# Patient Record
Sex: Female | Born: 1985 | Race: White | Hispanic: No | Marital: Married | Smoking: Never smoker
Health system: Southern US, Community
[De-identification: ages and names within clinical notes are randomized; demographics above are authoritative.]

## PROBLEM LIST (undated history)

## (undated) DIAGNOSIS — R7611 Nonspecific reaction to tuberculin skin test without active tuberculosis: Secondary | ICD-10-CM

## (undated) DIAGNOSIS — Z8619 Personal history of other infectious and parasitic diseases: Secondary | ICD-10-CM

## (undated) HISTORY — PX: WISDOM TOOTH EXTRACTION: SHX21

## (undated) HISTORY — DX: Personal history of other infectious and parasitic diseases: Z86.19

## (undated) HISTORY — DX: Nonspecific reaction to tuberculin skin test without active tuberculosis: R76.11

---

## 2012-01-27 DIAGNOSIS — R7611 Nonspecific reaction to tuberculin skin test without active tuberculosis: Secondary | ICD-10-CM

## 2012-01-27 HISTORY — DX: Nonspecific reaction to tuberculin skin test without active tuberculosis: R76.11

## 2013-01-06 ENCOUNTER — Encounter (HOSPITAL_COMMUNITY): Payer: Self-pay | Admitting: Pharmacist

## 2013-01-06 ENCOUNTER — Encounter (HOSPITAL_COMMUNITY): Payer: Self-pay | Admitting: *Deleted

## 2013-01-09 NOTE — H&P (Signed)
NAMEERABELLA, KUIPERS               ACCOUNT NO.:  [id number]  MEDICAL RECORD NO.:  [id number]  LOCATION:  PERIO                         FACILITY:  [facility]  PHYSICIAN:  Duke Salvia. Marcelle Overlie, M.D.DATE OF BIRTH:  1985-09-26  DATE OF ADMISSION:  01/04/2013 DATE OF DISCHARGE:                             HISTORY & PHYSICAL   CHIEF COMPLAINT:  Missed abortion.  HISTORY OF PRESENT ILLNESS:  A 27 year old, G1, P0.  The patient has been followed since November 19 for positive UPT.  Initial ultrasound showed a sac, but no definite fetal pole.  This has been followed over the course of two to three weeks with no change in the size of the sac and no evidence of IUP.  Most recent ultrasound was one day preop to verify once again.  Presents now for D and E.  This procedure including specific risks related to bleeding, infection, other complications may require additional surgery discussed with her which she understands and accepts.  PAST MEDICAL HISTORY:  ALLERGIES:  None.  CURRENT MEDICATIONS:  Prenatal vitamins.  SURGICAL HISTORY:  None.  REVIEW OF SYSTEMS:  Otherwise negative.  FAMILY HISTORY:  She denies alcohol, tobacco, or drug use.  Her family doctor is [facility] in [place].  PHYSICAL EXAMINATION:  VITAL SIGNS:  Temp 92, blood pressure 120/72. HEENT:  Unremarkable. NECK:  Supple without masses. LUNGS:  Clear. CARDIOVASCULAR:  Regular rate and rhythm without murmurs, rubs, or gallops. BREASTS:  Without masses. ABDOMEN:  Soft, flat, nontender.  Vulva, vagina, cervix were normal. Uterus was 7-week size, mid position.  Adnexa negative. EXTREMITIES:  Unremarkable. NEUROLOGIC:  Unremarkable.  IMPRESSION:  Missed abortion.  PLAN:  D and E.  Procedure and risks discussed as above.     Janiesha Diehl M. Marcelle Overlie, M.D.     RM[facility]/MEDQ  D:  01/09/2013  T:  01/09/2013  Job:  [id number]

## 2013-01-09 NOTE — H&P (Signed)
Taylor Miles  DICTATION # [id number] CSN# [id number]   Meriel Pica, MD 01/09/2013 1:34 PM

## 2013-01-10 ENCOUNTER — Ambulatory Visit (HOSPITAL_COMMUNITY): Payer: BC Managed Care – PPO | Admitting: Anesthesiology

## 2013-01-10 ENCOUNTER — Encounter (HOSPITAL_COMMUNITY): Payer: Self-pay | Admitting: *Deleted

## 2013-01-10 ENCOUNTER — Ambulatory Visit (HOSPITAL_COMMUNITY)
Admission: RE | Admit: 2013-01-10 | Discharge: 2013-01-10 | Disposition: A | Discharge status: Home / Self Care | Payer: BC Managed Care – PPO | Source: Ambulatory Visit | Attending: Obstetrics and Gynecology | Admitting: Obstetrics and Gynecology

## 2013-01-10 ENCOUNTER — Encounter (HOSPITAL_COMMUNITY): Payer: BC Managed Care – PPO | Admitting: Anesthesiology

## 2013-01-10 ENCOUNTER — Encounter (HOSPITAL_COMMUNITY)
Admission: RE | Disposition: A | Discharge status: Home / Self Care | Payer: Self-pay | Source: Ambulatory Visit | Attending: Obstetrics and Gynecology

## 2013-01-10 DIAGNOSIS — O021 Missed abortion: Secondary | ICD-10-CM | POA: Insufficient documentation

## 2013-01-10 HISTORY — PX: DILATION AND EVACUATION: SHX1459

## 2013-01-10 LAB — CBC
HCT: 32.8 % — ABNORMAL LOW (ref 36.0–46.0)
MCHC: 34.8 g/dL (ref 30.0–36.0)
MCV: 84.5 fL (ref 78.0–100.0)
RDW: 12.7 % (ref 11.5–15.5)
WBC: 5.6 10*3/uL (ref 4.0–10.5)

## 2013-01-10 SURGERY — DILATION AND EVACUATION, UTERUS
Anesthesia: Monitor Anesthesia Care | Site: Uterus

## 2013-01-10 MED ORDER — SUFENTANIL CITRATE 50 MCG/ML IV SOLN: INTRAVENOUS | Status: DC | PRN

## 2013-01-10 MED ORDER — KETOROLAC TROMETHAMINE 30 MG/ML IJ SOLN
INTRAMUSCULAR | Status: AC
  Filled 2013-01-10: qty 1

## 2013-01-10 MED ORDER — HYDROCODONE-IBUPROFEN 7.5-200 MG PO TABS: 1.0000 | ORAL_TABLET | Freq: Three times a day (TID) | ORAL | Status: DC | PRN

## 2013-01-10 MED ORDER — PROPOFOL 10 MG/ML IV EMUL
INTRAVENOUS | Status: AC
  Filled 2013-01-10: qty 20

## 2013-01-10 MED ORDER — KETOROLAC TROMETHAMINE 30 MG/ML IJ SOLN: 15.0000 mg | Freq: Once | INTRAMUSCULAR | Status: DC | PRN

## 2013-01-10 MED ORDER — MIDAZOLAM HCL 2 MG/2ML IJ SOLN
INTRAMUSCULAR | Status: AC
  Filled 2013-01-10: qty 2

## 2013-01-10 MED ORDER — MEPERIDINE HCL 25 MG/ML IJ SOLN: 6.2500 mg | INTRAMUSCULAR | Status: DC | PRN

## 2013-01-10 MED ORDER — FENTANYL CITRATE 0.05 MG/ML IJ SOLN: 25.0000 ug | INTRAMUSCULAR | Status: DC | PRN

## 2013-01-10 MED ORDER — PROPOFOL 10 MG/ML IV EMUL
INTRAVENOUS | Status: DC | PRN
  Administered 2013-01-10: 40 mg via INTRAVENOUS
  Administered 2013-01-10: 50 mg via INTRAVENOUS
  Administered 2013-01-10: 30 mg via INTRAVENOUS
  Administered 2013-01-10: 50 mg via INTRAVENOUS
  Administered 2013-01-10: 40 mg via INTRAVENOUS

## 2013-01-10 MED ORDER — MIDAZOLAM HCL 2 MG/2ML IJ SOLN
INTRAMUSCULAR | Status: DC | PRN
  Administered 2013-01-10: 1 mg via INTRAVENOUS

## 2013-01-10 MED ORDER — FENTANYL CITRATE 0.05 MG/ML IJ SOLN
INTRAMUSCULAR | Status: AC
  Filled 2013-01-10: qty 5

## 2013-01-10 MED ORDER — LACTATED RINGERS IV SOLN
INTRAVENOUS | Status: DC
  Administered 2013-01-10 (×2): via INTRAVENOUS

## 2013-01-10 MED ORDER — METOCLOPRAMIDE HCL 5 MG/ML IJ SOLN: 10.0000 mg | Freq: Once | INTRAMUSCULAR | Status: DC | PRN

## 2013-01-10 MED ORDER — ONDANSETRON HCL 4 MG/2ML IJ SOLN
INTRAMUSCULAR | Status: AC
  Filled 2013-01-10: qty 2

## 2013-01-10 MED ORDER — LIDOCAINE HCL (CARDIAC) 20 MG/ML IV SOLN
INTRAVENOUS | Status: AC
  Filled 2013-01-10: qty 5

## 2013-01-10 MED ORDER — KETOROLAC TROMETHAMINE 30 MG/ML IJ SOLN
INTRAMUSCULAR | Status: DC | PRN
  Administered 2013-01-10: 30 mg via INTRAVENOUS

## 2013-01-10 MED ORDER — ONDANSETRON HCL 4 MG/2ML IJ SOLN
INTRAMUSCULAR | Status: DC | PRN
  Administered 2013-01-10: 4 mg via INTRAVENOUS

## 2013-01-10 MED ORDER — LIDOCAINE HCL 1 % IJ SOLN
INTRAMUSCULAR | Status: DC | PRN
  Administered 2013-01-10: 10 mL

## 2013-01-10 MED ORDER — FENTANYL CITRATE 0.05 MG/ML IJ SOLN
INTRAMUSCULAR | Status: DC | PRN
  Administered 2013-01-10 (×2): 25 ug via INTRAVENOUS
  Administered 2013-01-10: 100 ug via INTRAVENOUS
  Administered 2013-01-10 (×2): 25 ug via INTRAVENOUS

## 2013-01-10 MED ORDER — LIDOCAINE HCL 1 % IJ SOLN
INTRAMUSCULAR | Status: AC
  Filled 2013-01-10: qty 20

## 2013-01-10 SURGICAL SUPPLY — 19 items
CATH ROBINSON RED A/P 16FR (CATHETERS) ×2 IMPLANT
CLOTH BEACON ORANGE TIMEOUT ST (SAFETY) ×2 IMPLANT
DECANTER SPIKE VIAL GLASS SM (MISCELLANEOUS) ×2 IMPLANT
GLOVE BIO SURGEON STRL SZ7 (GLOVE) ×2 IMPLANT
GOWN STRL REIN XL XLG (GOWN DISPOSABLE) ×4 IMPLANT
KIT BERKELEY 1ST TRIMESTER 3/8 (MISCELLANEOUS) ×2 IMPLANT
NEEDLE SPNL 22GX3.5 QUINCKE BK (NEEDLE) ×2 IMPLANT
NS IRRIG 1000ML POUR BTL (IV SOLUTION) ×2 IMPLANT
PACK VAGINAL MINOR WOMEN LF (CUSTOM PROCEDURE TRAY) ×2 IMPLANT
PAD OB MATERNITY 4.3X12.25 (PERSONAL CARE ITEMS) ×2 IMPLANT
PAD PREP 24X48 CUFFED NSTRL (MISCELLANEOUS) ×2 IMPLANT
SET BERKELEY SUCTION TUBING (SUCTIONS) ×2 IMPLANT
SUT CHROMIC 2 0 UR 5 27 (SUTURE) ×2 IMPLANT
SYR CONTROL 10ML LL (SYRINGE) ×2 IMPLANT
TOWEL OR 17X24 6PK STRL BLUE (TOWEL DISPOSABLE) ×4 IMPLANT
VACURETTE 10 RIGID CVD (CANNULA) IMPLANT
VACURETTE 7MM CVD STRL WRAP (CANNULA) ×2 IMPLANT
VACURETTE 8 RIGID CVD (CANNULA) IMPLANT
VACURETTE 9 RIGID CVD (CANNULA) IMPLANT

## 2013-01-10 NOTE — Anesthesia Postprocedure Evaluation (Signed)
  Anesthesia Post-op Note  Patient: Taylor Miles  Procedure(s) Performed: Procedure(s): DILATATION AND EVACUATION (N/A) Patient is awake and responsive. Pain and nausea are reasonably well controlled. Vital signs are stable and clinically acceptable. Oxygen saturation is clinically acceptable. There are no apparent anesthetic complications at this time. Patient is ready for discharge.

## 2013-01-10 NOTE — Progress Notes (Signed)
The patient was re-examined with no change in status 

## 2013-01-10 NOTE — Anesthesia Preprocedure Evaluation (Addendum)
Anesthesia Evaluation  Patient identified by MRN, date of birth, ID band Patient awake    Reviewed: Allergy & Precautions, H&P , NPO status , Patient's Chart, lab work & pertinent test results, reviewed documented beta blocker date and time   History of Anesthesia Complications Negative for: history of anesthetic complications  Airway Mallampati: I TM Distance: >3 FB Neck ROM: full    Dental  (+) Teeth Intact   Pulmonary neg pulmonary ROS,  breath sounds clear to auscultation        Cardiovascular negative cardio ROS  Rhythm:regular Rate:Normal     Neuro/Psych negative neurological ROS  negative psych ROS   GI/Hepatic negative GI ROS, Neg liver ROS,   Endo/Other  negative endocrine ROS  Renal/GU negative Renal ROS  negative genitourinary   Musculoskeletal   Abdominal   Peds  Hematology negative hematology ROS (+)   Anesthesia Other Findings   Reproductive/Obstetrics (+) Pregnancy (missed ab 7 weeks)                          Anesthesia Physical Anesthesia Plan  ASA: II  Anesthesia Plan: MAC   Post-op Pain Management:    Induction:   Airway Management Planned:   Additional Equipment:   Intra-op Plan:   Post-operative Plan:   Informed Consent: I have reviewed the patients History and Physical, chart, labs and discussed the procedure including the risks, benefits and alternatives for the proposed anesthesia with the patient or authorized representative who has indicated his/her understanding and acceptance.     Plan Discussed with: Surgeon and CRNA  Anesthesia Plan Comments:         Anesthesia Quick Evaluation

## 2013-01-10 NOTE — Op Note (Signed)
Preoperative diagnosis: Missed AB  Postoperative diagnosis: Same  Procedure: D&E  Surgeon: Marcelle Overlie  Anesthesia: Gen.  Complications: None  Drains: In and out Foley catheter  EBL: Less than 50 cc  Procedure and findings:  The patient taken the operating room after an adequate level of general anesthesia was obtained with the patient's legs in stirrups the perineum and vagina were prepped and draped in the usual fashion for D&E. Bladder was drained, EUA was carried out the uterus was 6 weeks size mid position adnexa negative. Speculum was positioned cervix grasped with tenaculum, paracervical block was then created by infiltrating at 3 and 9:00 submucosally 5-7 cc 1% plain Xylocaine at each site after negative aspiration. The uterus was sounded to 8 cm, progressively dilated to a 27-29 Pratt dilator, a 7 curved suction curet was then used to curette a small to moderate amount of tissue. When no further tissue could be removed a small blunt curette was used to explore the cavity revealing to be clean there was minimal bleeding she tolerated this well went to recovery room in good condition.  Dictated with [facility]  Kwabena Strutz M. Milana Obey.D.

## 2013-01-10 NOTE — Transfer of Care (Signed)
Immediate Anesthesia Transfer of Care Note  Patient: Taylor Miles  Procedure(s) Performed: Procedure(s): DILATATION AND EVACUATION (N/A)  Patient Location: PACU  Anesthesia Type:MAC  Level of Consciousness: awake, alert  and oriented  Airway & Oxygen Therapy: Patient Spontanous Breathing  Post-op Assessment: Report given to PACU RN and Post -op Vital signs reviewed and stable  Post vital signs: stable  Complications: No apparent anesthesia complications

## 2013-01-11 ENCOUNTER — Encounter (HOSPITAL_COMMUNITY): Payer: Self-pay | Admitting: Obstetrics and Gynecology

## 2013-01-26 NOTE — L&D Delivery Note (Signed)
SVD of VFI at 2039 on 11/19/13.  EBL 450cc.  APGARs 8,9.  Placenta to L&D. Head delivered LOA and body delivered atraumatically.  Mouth and nose bulb suctioned.  Cord clamped, cut and baby to abdomen.  Cord blood obtained.  Placenta delivered S/I/3VC.  Fundus was firmed with pitocin and massage.  2nd degree perineal lac repaired with 3-0 Vicryl in the normal fashion.  Mom and baby stable.    Mitchel HonourMegan Krissi Willaims, DO

## 2013-04-12 LAB — OB RESULTS CONSOLE GC/CHLAMYDIA
CHLAMYDIA, DNA PROBE: NEGATIVE
Chlamydia: NEGATIVE
Gonorrhea: NEGATIVE
Gonorrhea: NEGATIVE

## 2013-04-12 LAB — OB RESULTS CONSOLE ANTIBODY SCREEN: Antibody Screen: NEGATIVE

## 2013-04-12 LAB — OB RESULTS CONSOLE ABO/RH: "RH Type ": POSITIVE

## 2013-04-12 LAB — OB RESULTS CONSOLE RPR
RPR: NONREACTIVE
RPR: NONREACTIVE

## 2013-04-12 LAB — OB RESULTS CONSOLE HIV ANTIBODY (ROUTINE TESTING)
HIV: NONREACTIVE
HIV: NONREACTIVE

## 2013-04-12 LAB — OB RESULTS CONSOLE RUBELLA ANTIBODY, IGM: Rubella: UNDETERMINED

## 2013-04-12 LAB — OB RESULTS CONSOLE HEPATITIS B SURFACE ANTIGEN: Hepatitis B Surface Ag: NEGATIVE

## 2013-04-13 LAB — OB RESULTS CONSOLE GC/CHLAMYDIA
CHLAMYDIA, DNA PROBE: NEGATIVE
GC PROBE AMP, GENITAL: NEGATIVE

## 2013-04-13 LAB — OB RESULTS CONSOLE RPR: RPR: NONREACTIVE

## 2013-04-13 LAB — OB RESULTS CONSOLE HIV ANTIBODY (ROUTINE TESTING): HIV: NONREACTIVE

## 2013-10-17 LAB — OB RESULTS CONSOLE GBS
GBS: NEGATIVE
STREP GROUP B AG: NEGATIVE

## 2013-10-18 LAB — OB RESULTS CONSOLE GBS: STREP GROUP B AG: NEGATIVE

## 2013-11-17 ENCOUNTER — Inpatient Hospital Stay (HOSPITAL_COMMUNITY)
Admission: AD | Admit: 2013-11-17 | Payer: BC Managed Care – PPO | Source: Ambulatory Visit | Admitting: Obstetrics and Gynecology

## 2013-11-19 ENCOUNTER — Inpatient Hospital Stay (HOSPITAL_COMMUNITY)
Admission: RE | Admit: 2013-11-19 | Discharge: 2013-11-21 | DRG: 775 | Disposition: A | Discharge status: Home / Self Care | Payer: 59 | Source: Ambulatory Visit | Attending: Obstetrics & Gynecology | Admitting: Obstetrics & Gynecology

## 2013-11-19 ENCOUNTER — Encounter (HOSPITAL_COMMUNITY): Payer: 59 | Admitting: Anesthesiology

## 2013-11-19 ENCOUNTER — Inpatient Hospital Stay (HOSPITAL_COMMUNITY): Payer: 59 | Admitting: Anesthesiology

## 2013-11-19 ENCOUNTER — Encounter (HOSPITAL_COMMUNITY): Payer: Self-pay

## 2013-11-19 DIAGNOSIS — Z3A40 40 weeks gestation of pregnancy: Secondary | ICD-10-CM | POA: Diagnosis present

## 2013-11-19 DIAGNOSIS — O480 Post-term pregnancy: Principal | ICD-10-CM | POA: Diagnosis present

## 2013-11-19 DIAGNOSIS — Z3490 Encounter for supervision of normal pregnancy, unspecified, unspecified trimester: Secondary | ICD-10-CM

## 2013-11-19 LAB — TYPE AND SCREEN
ABO/RH(D): O POS
Antibody Screen: NEGATIVE

## 2013-11-19 LAB — CBC
HCT: 36.9 % (ref 36.0–46.0)
Hemoglobin: 12.7 g/dL (ref 12.0–15.0)
MCH: 31.3 pg (ref 26.0–34.0)
MCHC: 34.4 g/dL (ref 30.0–36.0)
MCV: 90.9 fL (ref 78.0–100.0)
PLATELETS: 223 10*3/uL (ref 150–400)
RBC: 4.06 MIL/uL (ref 3.87–5.11)
RDW: 13.6 % (ref 11.5–15.5)
WBC: 9.6 10*3/uL (ref 4.0–10.5)

## 2013-11-19 LAB — RPR

## 2013-11-19 MED ORDER — CITRIC ACID-SODIUM CITRATE 334-500 MG/5ML PO SOLN: 30.0000 mL | ORAL | Status: DC | PRN

## 2013-11-19 MED ORDER — LIDOCAINE HCL (PF) 1 % IJ SOLN
30.0000 mL | INTRAMUSCULAR | Status: DC | PRN
  Filled 2013-11-19: qty 30

## 2013-11-19 MED ORDER — LACTATED RINGERS IV SOLN
INTRAVENOUS | Status: DC
  Administered 2013-11-19 (×2): via INTRAVENOUS

## 2013-11-19 MED ORDER — IBUPROFEN 600 MG PO TABS
600.0000 mg | ORAL_TABLET | Freq: Four times a day (QID) | ORAL | Status: DC
  Administered 2013-11-19 – 2013-11-21 (×8): 600 mg via ORAL
  Filled 2013-11-19 (×8): qty 1

## 2013-11-19 MED ORDER — FENTANYL 2.5 MCG/ML BUPIVACAINE 1/10 % EPIDURAL INFUSION (WH - ANES)
14.0000 mL/h | INTRAMUSCULAR | Status: DC | PRN
  Administered 2013-11-19 (×2): 14 mL/h via EPIDURAL
  Filled 2013-11-19: qty 125

## 2013-11-19 MED ORDER — OXYCODONE-ACETAMINOPHEN 5-325 MG PO TABS: 2.0000 | ORAL_TABLET | ORAL | Status: DC | PRN

## 2013-11-19 MED ORDER — OXYTOCIN 40 UNITS IN LACTATED RINGERS INFUSION - SIMPLE MED: 62.5000 mL/h | INTRAVENOUS | Status: DC

## 2013-11-19 MED ORDER — EPHEDRINE 5 MG/ML INJ
10.0000 mg | INTRAVENOUS | Status: DC | PRN
Start: 2013-11-19 — End: 2013-11-21
  Filled 2013-11-19: qty 2

## 2013-11-19 MED ORDER — LIDOCAINE HCL (PF) 1 % IJ SOLN
INTRAMUSCULAR | Status: DC | PRN
  Administered 2013-11-19 (×2): 5 mL

## 2013-11-19 MED ORDER — ONDANSETRON HCL 4 MG/2ML IJ SOLN
4.0000 mg | Freq: Four times a day (QID) | INTRAMUSCULAR | Status: DC | PRN
Start: 2013-11-19 — End: 2013-11-20
  Filled 2013-11-19: qty 2

## 2013-11-19 MED ORDER — LACTATED RINGERS IV SOLN: 500.0000 mL | INTRAVENOUS | Status: DC | PRN

## 2013-11-19 MED ORDER — OXYCODONE-ACETAMINOPHEN 5-325 MG PO TABS: 1.0000 | ORAL_TABLET | ORAL | Status: DC | PRN

## 2013-11-19 MED ORDER — OXYTOCIN BOLUS FROM INFUSION: 500.0000 mL | INTRAVENOUS | Status: DC

## 2013-11-19 MED ORDER — DIPHENHYDRAMINE HCL 50 MG/ML IJ SOLN: 12.5000 mg | INTRAMUSCULAR | Status: DC | PRN

## 2013-11-19 MED ORDER — TERBUTALINE SULFATE 1 MG/ML IJ SOLN: 0.2500 mg | Freq: Once | INTRAMUSCULAR | Status: AC | PRN

## 2013-11-19 MED ORDER — PHENYLEPHRINE 40 MCG/ML (10ML) SYRINGE FOR IV PUSH (FOR BLOOD PRESSURE SUPPORT)
80.0000 ug | PREFILLED_SYRINGE | INTRAVENOUS | Status: DC | PRN
  Filled 2013-11-19: qty 2

## 2013-11-19 MED ORDER — ACETAMINOPHEN 325 MG PO TABS: 650.0000 mg | ORAL_TABLET | ORAL | Status: DC | PRN

## 2013-11-19 MED ORDER — EPHEDRINE 5 MG/ML INJ
10.0000 mg | INTRAVENOUS | Status: DC | PRN
  Filled 2013-11-19: qty 2

## 2013-11-19 MED ORDER — OXYTOCIN 40 UNITS IN LACTATED RINGERS INFUSION - SIMPLE MED
1.0000 m[IU]/min | INTRAVENOUS | Status: DC
  Administered 2013-11-19: 2 m[IU]/min via INTRAVENOUS
  Filled 2013-11-19: qty 1000

## 2013-11-19 MED ORDER — FLEET ENEMA 7-19 GM/118ML RE ENEM: 1.0000 | ENEMA | RECTAL | Status: DC | PRN

## 2013-11-19 MED ORDER — LACTATED RINGERS IV SOLN: 500.0000 mL | Freq: Once | INTRAVENOUS | Status: DC

## 2013-11-19 NOTE — Anesthesia Procedure Notes (Signed)
Epidural Patient location during procedure: OB Start time: 11/19/2013 12:20 PM  Staffing Anesthesiologist: Brayton CavesJACKSON, Lemont Sitzmann Performed by: anesthesiologist   Preanesthetic Checklist Completed: patient identified, site marked, surgical consent, pre-op evaluation, timeout performed, IV checked, risks and benefits discussed and monitors and equipment checked  Epidural Patient position: sitting Prep: site prepped and draped and DuraPrep Patient monitoring: continuous pulse ox and blood pressure Approach: midline Location: L3-L4 Injection technique: LOR air  Needle:  Needle type: Tuohy  Needle gauge: 17 G Needle length: 9 cm and 9 Needle insertion depth: 4 cm Catheter type: closed end flexible Catheter size: 19 Gauge Catheter at skin depth: 10 cm Test dose: negative  Assessment Events: blood not aspirated, injection not painful, no injection resistance, negative IV test and no paresthesia  Additional Notes Patient identified.  Risk benefits discussed including failed block, incomplete pain control, headache, nerve damage, paralysis, blood pressure changes, nausea, vomiting, reactions to medication both toxic or allergic, and postpartum back pain.  Patient expressed understanding and wished to proceed.  All questions were answered.  Sterile technique used throughout procedure and epidural site dressed with sterile barrier dressing. No paresthesia or other complications noted.The patient did not experience any signs of intravascular injection such as tinnitus or metallic taste in mouth nor signs of intrathecal spread such as rapid motor block. Please see nursing notes for vital signs.

## 2013-11-19 NOTE — Progress Notes (Signed)
Pt sitting on commode and unable to urinate, bladder scan done, more than 200cc in bladder.  Pt states she would like to sit awhile longer and try to void before being i&o cath'd.

## 2013-11-19 NOTE — H&P (Signed)
Taylor Miles is a 28 y.o. female presenting for post-dates IOL.  Antepartum course has been uncomplicated.  GBS negative.  Maternal Medical History:  Contractions: Onset was 1 week ago.   Frequency: rare.   Perceived severity is mild.    Fetal activity: Perceived fetal activity is normal.   Last perceived fetal movement was within the past hour.    Prenatal complications: no prenatal complications Prenatal Complications - Diabetes: none.    OB History   Grav Para Term Preterm Abortions TAB SAB Ect Mult Living   2    1  1         No past medical history on file. Past Surgical History  Procedure Laterality Date  . Wisdom tooth extraction    . Dilation and evacuation N/A 01/10/2013    Procedure: DILATATION AND EVACUATION;  Surgeon: Meriel Picaichard M Holland, MD;  Location: [facility];  Service: Gynecology;  Laterality: N/A;   Family History: family history is not on file. Social History:  reports that she has never smoked. She has never used smokeless tobacco. She reports that she does not drink alcohol or use illicit drugs.   Prenatal Transfer Tool  Maternal Diabetes: No Genetic Screening: Normal Maternal Ultrasounds/Referrals: Normal Fetal Ultrasounds or other Referrals:  None Maternal Substance Abuse:  No Significant Maternal Medications:  None Significant Maternal Lab Results:  Lab values include: Group B Strep negative Other Comments:  None  ROS  Dilation: 3 Effacement (%): 50 Station: -2 Exam by:: Maico Mulvehill Blood pressure 99/67, pulse 93, temperature 97.7 F (36.5 C), temperature source Oral, resp. rate 18. AROM with clear fluid  Maternal Exam:  Uterine Assessment: Contraction strength is mild.  Contraction frequency is rare.   Abdomen: Patient reports no abdominal tenderness. Fundal height is c/w dates.   Estimated fetal weight is 7#8.   Fetal presentation: vertex  Introitus: Normal vulva. Amniotic fluid character: clear.  Pelvis: adequate for delivery.       Physical Exam  Constitutional: She is oriented to person, place, and time. She appears well-developed and well-nourished.  GI: Soft. There is no rebound and no guarding.  Neurological: She is alert and oriented to person, place, and time.  Skin: Skin is warm and dry.  Psychiatric: She has a normal mood and affect. Her behavior is normal.    Prenatal labs: ABO, Rh: --/--/O POS (10/25 0740) Antibody: NEG (10/25 0740) Rubella: Equivocal (03/18 0000) RPR: Nonreactive (03/19 0000)  HBsAg: Negative (03/18 0000)  HIV: Non-reactive (03/19 0000)  GBS: Negative (09/23 0000)   Assessment/Plan: 28yo G2P0 at 2764w2d for PDI -AROM performed -Will add pitocin as needed -Epidural if desired   Crispin Vogel 11/19/2013, 9:25 AM

## 2013-11-19 NOTE — Anesthesia Preprocedure Evaluation (Signed)

## 2013-11-19 NOTE — Progress Notes (Signed)
Pt straight cath'd on commode, pt than passes out.  Staff Assist button pressed, additional RN's in room, smelling salt given, pt awake and transferred to wc.

## 2013-11-20 LAB — CBC
HCT: 31.5 % — ABNORMAL LOW (ref 36.0–46.0)
HEMOGLOBIN: 10.9 g/dL — AB (ref 12.0–15.0)
MCH: 31.2 pg (ref 26.0–34.0)
MCHC: 34.6 g/dL (ref 30.0–36.0)
MCV: 90.3 fL (ref 78.0–100.0)
Platelets: 203 10*3/uL (ref 150–400)
RBC: 3.49 MIL/uL — AB (ref 3.87–5.11)
RDW: 13.7 % (ref 11.5–15.5)
WBC: 19.4 10*3/uL — ABNORMAL HIGH (ref 4.0–10.5)

## 2013-11-20 MED ORDER — BENZOCAINE-MENTHOL 20-0.5 % EX AERO
1.0000 "application " | INHALATION_SPRAY | CUTANEOUS | Status: DC | PRN
  Administered 2013-11-20: 1 via TOPICAL
  Filled 2013-11-20: qty 56

## 2013-11-20 MED ORDER — TETANUS-DIPHTH-ACELL PERTUSSIS 5-2.5-18.5 LF-MCG/0.5 IM SUSP: 0.5000 mL | Freq: Once | INTRAMUSCULAR | Status: DC

## 2013-11-20 MED ORDER — OXYCODONE-ACETAMINOPHEN 5-325 MG PO TABS: 1.0000 | ORAL_TABLET | ORAL | Status: DC | PRN

## 2013-11-20 MED ORDER — ONDANSETRON HCL 4 MG/2ML IJ SOLN
4.0000 mg | INTRAMUSCULAR | Status: DC | PRN
Start: 2013-11-20 — End: 2013-11-21

## 2013-11-20 MED ORDER — PRENATAL MULTIVITAMIN CH
1.0000 | ORAL_TABLET | Freq: Every day | ORAL | Status: DC
  Administered 2013-11-20 – 2013-11-21 (×2): 1 via ORAL
  Filled 2013-11-20 (×2): qty 1

## 2013-11-20 MED ORDER — DIPHENHYDRAMINE HCL 25 MG PO CAPS: 25.0000 mg | ORAL_CAPSULE | Freq: Four times a day (QID) | ORAL | Status: DC | PRN

## 2013-11-20 MED ORDER — ZOLPIDEM TARTRATE 5 MG PO TABS: 5.0000 mg | ORAL_TABLET | Freq: Every evening | ORAL | Status: DC | PRN

## 2013-11-20 MED ORDER — SENNOSIDES-DOCUSATE SODIUM 8.6-50 MG PO TABS
2.0000 | ORAL_TABLET | ORAL | Status: DC
  Administered 2013-11-20 – 2013-11-21 (×2): 2 via ORAL
  Filled 2013-11-20 (×2): qty 2

## 2013-11-20 MED ORDER — ONDANSETRON HCL 4 MG PO TABS: 4.0000 mg | ORAL_TABLET | ORAL | Status: DC | PRN

## 2013-11-20 MED ORDER — OXYCODONE-ACETAMINOPHEN 5-325 MG PO TABS: 2.0000 | ORAL_TABLET | ORAL | Status: DC | PRN

## 2013-11-20 MED ORDER — LANOLIN HYDROUS EX OINT: TOPICAL_OINTMENT | CUTANEOUS | Status: DC | PRN

## 2013-11-20 MED ORDER — DIBUCAINE 1 % RE OINT: 1.0000 "application " | TOPICAL_OINTMENT | RECTAL | Status: DC | PRN

## 2013-11-20 MED ORDER — SIMETHICONE 80 MG PO CHEW: 80.0000 mg | CHEWABLE_TABLET | ORAL | Status: DC | PRN

## 2013-11-20 MED ORDER — WITCH HAZEL-GLYCERIN EX PADS
1.0000 "application " | MEDICATED_PAD | CUTANEOUS | Status: DC | PRN
  Administered 2013-11-20: 1 via TOPICAL

## 2013-11-20 NOTE — Lactation Note (Signed)
This note was copied from the chart of Girl Jaci Smolinsky. Lactation Consultation Note Mom states baby has BF well to Lt. Breast for 20 min. Breast to Rt. Breast 10-15 min. Earlier, but has difficulty getting baby latched. Assisted baby to Rt. Breast. Stated felt slight pinching, demonstrated chin tug for deeper latch. Noted good latch and suckling. Hand expression taught w/no colostrum noted.  Mom has very soft breast. States hasn't noted any colostrum. Mom encouraged to feed baby 8-12 times/24 hours and with feeding cues. Mom encouraged to feed baby w/feeding cuesEncouraged to call for assistance if needed and to verify proper latch.[facility] brochure given w/resources, support groups and LC services.Mother informed of post-discharge support and given phone number to the lactation department, including services for phone call assistance; out-patient appointments; and breastfeeding support group. List of other breastfeeding resources in the community given in the handout. Encouraged mother to call for problems or concerns related to breastfeeding. Educated about newborn behavior. Referred to Baby and Me Book in Breastfeeding section Pg. 22-23 for position options and Proper latch demonstration.Mom encouraged to do skin-to-skin.Mom encouraged to waken baby for feeds.  Patient Name: Girl Ashok Norrismina Witucki WUJWJ'XToday's Date: 11/20/2013 Reason for consult: Initial assessment   Maternal Data Has patient been taught Hand Expression?: Yes Does the patient have breastfeeding experience prior to this delivery?: No  Feeding Feeding Type: Breast Fed Length of feed: 7 min  LATCH Score/Interventions Latch: Repeated attempts needed to sustain latch, nipple held in mouth throughout feeding, stimulation needed to elicit sucking reflex. Intervention(s): Adjust position;Assist with latch;Breast massage;Breast compression  Audible Swallowing: None Intervention(s): Skin to skin;Hand expression  Type of Nipple: Everted at  rest and after stimulation  Comfort (Breast/Nipple): Soft / non-tender     Hold (Positioning): Assistance needed to correctly position infant at breast and maintain latch. Intervention(s): Breastfeeding basics reviewed;Support Pillows;Position options;Skin to skin  LATCH Score: 6  Lactation Tools Discussed/Used     Consult Status Consult Status: Follow-up Date: 11/20/13 Follow-up type: In-patient    Charyl DancerCARVER, Kule Gascoigne G 11/20/2013, 4:44 AM

## 2013-11-20 NOTE — Progress Notes (Signed)
Post Partum Day 1 Subjective: no complaints, up ad lib, voiding and tolerating PO  Objective: Blood pressure 101/62, pulse 79, temperature 98.3 F (36.8 C), temperature source Oral, resp. rate 18, height 5' 7.5" (1.715 m), weight 158 lb (71.668 kg), SpO2 99.00%, unknown if currently breastfeeding.  Physical Exam:  General: alert and cooperative Lochia: appropriate Uterine Fundus: firm Incision: healing well DVT Evaluation: No evidence of DVT seen on physical exam. Negative Homan's sign. No cords or calf tenderness. No significant calf/ankle edema.   Recent Labs  11/19/13 0740 11/20/13 0557  HGB 12.7 10.9*  HCT 36.9 31.5*    Assessment/Plan: Plan for discharge tomorrow   LOS: 1 day   Niam Nepomuceno G 11/20/2013, 8:22 AM

## 2013-11-20 NOTE — Anesthesia Postprocedure Evaluation (Signed)
Anesthesia Post Note  Patient: Taylor Miles  Procedure(s) Performed: * No procedures listed *  Anesthesia type: Epidural  Patient location: Mother/Baby  Post pain: Pain level controlled  Post assessment: Post-op Vital signs reviewed  Last Vitals:  Filed Vitals:   11/20/13 0339  BP: 101/62  Pulse: 79  Temp: 36.8 C  Resp: 18    Post vital signs: Reviewed  Level of consciousness:alert  Complications: No apparent anesthesia complications

## 2013-11-21 ENCOUNTER — Ambulatory Visit: Payer: Self-pay

## 2013-11-21 MED ORDER — MEASLES, MUMPS & RUBELLA VAC ~~LOC~~ INJ
0.5000 mL | INJECTION | Freq: Once | SUBCUTANEOUS | Status: AC
  Administered 2013-11-21: 0.5 mL via SUBCUTANEOUS
  Filled 2013-11-21 (×2): qty 0.5

## 2013-11-21 NOTE — Lactation Note (Signed)
This note was copied from the chart of Girl Virginia Rump. Lactation Consultation Note  Patient Name: Girl Ashok Norrismina Mooneyham AVWUJ'WToday's Date: 11/21/2013  baby has been very fussy, crying and latching on and off the breast during feedings. Assisted mother with positioning and latching baby in football, cross cradle and side-lying because baby did not settle and continued to show aggressive cues to feed. Mother's nipples and red, blistered, and inflamed. She is using a #20 Nipple shield for latching to decrease pain when direct contact with the nipple is made. She is using comfort gels and expressing milk onto nipples to promote healing. Oral gum massage and suck training exercises were done with the baby and taught to the mother. Baby was able to latch deeply onto the breast using the shield and small amount colostrum noted. Mother becoming emotional due to baby being fussy and not becoming satisfied after almost one hour of breast feeding. Unable to hand express colostrum to spoon feed. With parents consent. 2-3 ml of Similac hydrolyzed formula was inserted with curved tip syringe into the shield. Baby fed aggressively and fell asleep. Instructed parents how to use curved syringe as needed. Lactation appointment scheduled for Oct. 30 at 4pm.   Maternal Data    Feeding    LATCH Score/Interventions                      Lactation Tools Discussed/Used     Consult Status      Omar PersonDaly, Gabrielle Mester M 11/21/2013, 7:11 PM

## 2013-11-21 NOTE — Discharge Summary (Signed)
Obstetric Discharge Summary Reason for Admission: induction of labor Prenatal Procedures: ultrasound Intrapartum Procedures: spontaneous vaginal delivery Postpartum Procedures: none Complications-Operative and Postpartum: 2 degree perineal laceration Hemoglobin  Date Value Ref Range Status  11/20/2013 10.9* 12.0 - 15.0 g/dL Final     HCT  Date Value Ref Range Status  11/20/2013 31.5* 36.0 - 46.0 % Final    Physical Exam:  General: alert and cooperative Lochia: appropriate Uterine Fundus: firm Incision: healing well DVT Evaluation: No evidence of DVT seen on physical exam. Negative Homan's sign. No cords or calf tenderness. No significant calf/ankle edema.  Discharge Diagnoses: Term Pregnancy-delivered  Discharge Information: Date: 11/21/2013 Activity: pelvic rest Diet: routine Medications: PNV and Ibuprofen Condition: stable Instructions: refer to practice specific booklet Discharge to: home   Newborn Data: Live born female  Birth Weight: 8 lb 6.8 oz (3820 g) APGAR: 8, 9  Home with mother.  Taylor Miles G 11/21/2013, 8:25 AM

## 2013-11-24 ENCOUNTER — Ambulatory Visit (HOSPITAL_COMMUNITY): Payer: 59

## 2013-11-27 ENCOUNTER — Ambulatory Visit: Payer: Self-pay

## 2013-11-27 ENCOUNTER — Encounter (HOSPITAL_COMMUNITY): Payer: Self-pay

## 2014-01-10 ENCOUNTER — Other Ambulatory Visit: Payer: Self-pay | Admitting: Obstetrics and Gynecology

## 2014-01-11 LAB — CYTOLOGY - PAP

## 2015-01-24 LAB — OB RESULTS CONSOLE ABO/RH: RH Type: POSITIVE

## 2015-01-24 LAB — OB RESULTS CONSOLE GBS: GBS: POSITIVE

## 2015-01-24 LAB — OB RESULTS CONSOLE HIV ANTIBODY (ROUTINE TESTING): HIV: NONREACTIVE

## 2015-01-24 LAB — OB RESULTS CONSOLE GC/CHLAMYDIA
Chlamydia: NEGATIVE
Gonorrhea: NEGATIVE

## 2015-01-24 LAB — OB RESULTS CONSOLE HEPATITIS B SURFACE ANTIGEN: HEP B S AG: NEGATIVE

## 2015-01-24 LAB — OB RESULTS CONSOLE RUBELLA ANTIBODY, IGM: RUBELLA: IMMUNE

## 2015-01-24 LAB — OB RESULTS CONSOLE RPR: RPR: NONREACTIVE

## 2015-01-24 LAB — OB RESULTS CONSOLE ANTIBODY SCREEN: Antibody Screen: NEGATIVE

## 2015-01-27 NOTE — L&D Delivery Note (Signed)
Delivery Note At 2:54 PM a viable female was delivered via  (Presentation: ;  ).  APGAR: , ; weight  .   Placenta status:spont>>intact , .  Cord:  with the following complications: .  Cord pH: not sent Tight nuchal X 1 , clamped + cut Anesthesia: Epidural  Episiotomy:  none Lacerations: sec deg  Suture Repair: 3.0 vicryl rapide Est. Blood Loss (mL):  400  Mom to postpartum.  Baby to Couplet care / Skin to Skin.  Adnan Vanvoorhis M 07/26/2015, 3:11 PM

## 2015-06-04 ENCOUNTER — Other Ambulatory Visit: Payer: Self-pay | Admitting: Obstetrics and Gynecology

## 2015-06-04 DIAGNOSIS — N6001 Solitary cyst of right breast: Secondary | ICD-10-CM

## 2015-06-05 ENCOUNTER — Ambulatory Visit
Admission: RE | Admit: 2015-06-05 | Discharge: 2015-06-05 | Disposition: A | Discharge status: Home / Self Care | Payer: 59 | Source: Ambulatory Visit | Attending: Obstetrics and Gynecology | Admitting: Obstetrics and Gynecology

## 2015-06-05 ENCOUNTER — Other Ambulatory Visit: Payer: Self-pay | Admitting: Obstetrics and Gynecology

## 2015-06-05 DIAGNOSIS — N6310 Unspecified lump in the right breast, unspecified quadrant: Secondary | ICD-10-CM

## 2015-06-05 DIAGNOSIS — N6001 Solitary cyst of right breast: Secondary | ICD-10-CM

## 2015-06-06 ENCOUNTER — Other Ambulatory Visit: Payer: Self-pay | Admitting: Obstetrics and Gynecology

## 2015-06-06 DIAGNOSIS — N6310 Unspecified lump in the right breast, unspecified quadrant: Secondary | ICD-10-CM

## 2015-06-07 ENCOUNTER — Ambulatory Visit
Admission: RE | Admit: 2015-06-07 | Discharge: 2015-06-07 | Disposition: A | Discharge status: Home / Self Care | Payer: 59 | Source: Ambulatory Visit | Attending: Obstetrics and Gynecology | Admitting: Obstetrics and Gynecology

## 2015-06-07 DIAGNOSIS — N6310 Unspecified lump in the right breast, unspecified quadrant: Secondary | ICD-10-CM

## 2015-06-13 ENCOUNTER — Other Ambulatory Visit: Payer: Self-pay

## 2015-07-25 ENCOUNTER — Telehealth (HOSPITAL_COMMUNITY): Payer: Self-pay | Admitting: *Deleted

## 2015-07-25 ENCOUNTER — Encounter (HOSPITAL_COMMUNITY): Payer: Self-pay | Admitting: *Deleted

## 2015-07-25 NOTE — H&P (Signed)
Anette Riedelmina Blake  DICTATION # [id number] CSN# [id number]   Meriel PicaHOLLAND,Keirra Zeimet M, MD 07/25/2015 1:43 PM

## 2015-07-25 NOTE — Telephone Encounter (Signed)
Preadmission screen  

## 2015-07-26 ENCOUNTER — Inpatient Hospital Stay (HOSPITAL_COMMUNITY): Payer: 59 | Admitting: Anesthesiology

## 2015-07-26 ENCOUNTER — Inpatient Hospital Stay (HOSPITAL_COMMUNITY)
Admission: RE | Admit: 2015-07-26 | Discharge: 2015-07-28 | DRG: 775 | Disposition: A | Discharge status: Home / Self Care | Payer: 59 | Source: Ambulatory Visit | Attending: Obstetrics and Gynecology | Admitting: Obstetrics and Gynecology

## 2015-07-26 ENCOUNTER — Encounter (HOSPITAL_COMMUNITY): Payer: Self-pay

## 2015-07-26 DIAGNOSIS — Z3A39 39 weeks gestation of pregnancy: Secondary | ICD-10-CM | POA: Diagnosis not present

## 2015-07-26 DIAGNOSIS — O99824 Streptococcus B carrier state complicating childbirth: Secondary | ICD-10-CM | POA: Diagnosis present

## 2015-07-26 DIAGNOSIS — Z3490 Encounter for supervision of normal pregnancy, unspecified, unspecified trimester: Secondary | ICD-10-CM

## 2015-07-26 LAB — CBC
HEMATOCRIT: 36.2 % (ref 36.0–46.0)
HEMOGLOBIN: 12.5 g/dL (ref 12.0–15.0)
MCH: 30.9 pg (ref 26.0–34.0)
MCHC: 34.5 g/dL (ref 30.0–36.0)
MCV: 89.6 fL (ref 78.0–100.0)
Platelets: 206 10*3/uL (ref 150–400)
RBC: 4.04 MIL/uL (ref 3.87–5.11)
RDW: 13.7 % (ref 11.5–15.5)
WBC: 8.8 10*3/uL (ref 4.0–10.5)

## 2015-07-26 LAB — TYPE AND SCREEN
ABO/RH(D): O POS
Antibody Screen: NEGATIVE

## 2015-07-26 LAB — RPR: RPR: NONREACTIVE

## 2015-07-26 MED ORDER — LACTATED RINGERS IV SOLN: 500.0000 mL | Freq: Once | INTRAVENOUS | Status: DC

## 2015-07-26 MED ORDER — SIMETHICONE 80 MG PO CHEW: 80.0000 mg | CHEWABLE_TABLET | ORAL | Status: DC | PRN

## 2015-07-26 MED ORDER — DIPHENHYDRAMINE HCL 50 MG/ML IJ SOLN: 12.5000 mg | INTRAMUSCULAR | Status: DC | PRN

## 2015-07-26 MED ORDER — EPHEDRINE 5 MG/ML INJ
10.0000 mg | INTRAVENOUS | Status: DC | PRN
  Filled 2015-07-26: qty 2

## 2015-07-26 MED ORDER — ZOLPIDEM TARTRATE 5 MG PO TABS: 5.0000 mg | ORAL_TABLET | Freq: Every evening | ORAL | Status: DC | PRN

## 2015-07-26 MED ORDER — SENNOSIDES-DOCUSATE SODIUM 8.6-50 MG PO TABS
2.0000 | ORAL_TABLET | ORAL | Status: DC
  Administered 2015-07-27 (×2): 2 via ORAL
  Filled 2015-07-26 (×2): qty 2

## 2015-07-26 MED ORDER — MEASLES, MUMPS & RUBELLA VAC ~~LOC~~ INJ: 0.5000 mL | INJECTION | Freq: Once | SUBCUTANEOUS | Status: DC

## 2015-07-26 MED ORDER — OXYTOCIN 40 UNITS IN LACTATED RINGERS INFUSION - SIMPLE MED: 2.5000 [IU]/h | INTRAVENOUS | Status: DC

## 2015-07-26 MED ORDER — LIDOCAINE HCL (PF) 1 % IJ SOLN
30.0000 mL | INTRAMUSCULAR | Status: DC | PRN
  Filled 2015-07-26: qty 30

## 2015-07-26 MED ORDER — OXYTOCIN 40 UNITS IN LACTATED RINGERS INFUSION - SIMPLE MED
1.0000 m[IU]/min | INTRAVENOUS | Status: DC
  Administered 2015-07-26: 2 m[IU]/min via INTRAVENOUS
  Filled 2015-07-26: qty 1000

## 2015-07-26 MED ORDER — SOD CITRATE-CITRIC ACID 500-334 MG/5ML PO SOLN: 30.0000 mL | ORAL | Status: DC | PRN

## 2015-07-26 MED ORDER — OXYCODONE-ACETAMINOPHEN 5-325 MG PO TABS: 2.0000 | ORAL_TABLET | ORAL | Status: DC | PRN

## 2015-07-26 MED ORDER — DIBUCAINE 1 % RE OINT
1.0000 "application " | TOPICAL_OINTMENT | RECTAL | Status: DC | PRN
  Administered 2015-07-26: 1 via RECTAL
  Filled 2015-07-26: qty 28

## 2015-07-26 MED ORDER — PHENYLEPHRINE 40 MCG/ML (10ML) SYRINGE FOR IV PUSH (FOR BLOOD PRESSURE SUPPORT)
80.0000 ug | PREFILLED_SYRINGE | INTRAVENOUS | Status: DC | PRN
Start: 2015-07-26 — End: 2015-07-28
  Filled 2015-07-26: qty 5
  Filled 2015-07-26: qty 10

## 2015-07-26 MED ORDER — WITCH HAZEL-GLYCERIN EX PADS
1.0000 "application " | MEDICATED_PAD | CUTANEOUS | Status: DC | PRN
  Administered 2015-07-26: 1 via TOPICAL

## 2015-07-26 MED ORDER — ONDANSETRON HCL 4 MG/2ML IJ SOLN: 4.0000 mg | Freq: Four times a day (QID) | INTRAMUSCULAR | Status: DC | PRN

## 2015-07-26 MED ORDER — DIPHENHYDRAMINE HCL 25 MG PO CAPS: 25.0000 mg | ORAL_CAPSULE | Freq: Four times a day (QID) | ORAL | Status: DC | PRN

## 2015-07-26 MED ORDER — FLEET ENEMA 7-19 GM/118ML RE ENEM: 1.0000 | ENEMA | Freq: Every day | RECTAL | Status: DC | PRN

## 2015-07-26 MED ORDER — ACETAMINOPHEN 325 MG PO TABS: 650.0000 mg | ORAL_TABLET | ORAL | Status: DC | PRN

## 2015-07-26 MED ORDER — LIDOCAINE HCL (PF) 1 % IJ SOLN
INTRAMUSCULAR | Status: DC | PRN
  Administered 2015-07-26: 5 mL
  Administered 2015-07-26: 2 mL
  Administered 2015-07-26: 3 mL

## 2015-07-26 MED ORDER — LACTATED RINGERS IV SOLN
INTRAVENOUS | Status: DC
  Administered 2015-07-26: 08:00:00 via INTRAVENOUS

## 2015-07-26 MED ORDER — LACTATED RINGERS IV SOLN: 500.0000 mL | INTRAVENOUS | Status: DC | PRN

## 2015-07-26 MED ORDER — OXYCODONE-ACETAMINOPHEN 5-325 MG PO TABS: 1.0000 | ORAL_TABLET | ORAL | Status: DC | PRN

## 2015-07-26 MED ORDER — IBUPROFEN 800 MG PO TABS
800.0000 mg | ORAL_TABLET | Freq: Three times a day (TID) | ORAL | Status: DC | PRN
  Administered 2015-07-26 – 2015-07-28 (×5): 800 mg via ORAL
  Filled 2015-07-26 (×7): qty 1

## 2015-07-26 MED ORDER — TERBUTALINE SULFATE 1 MG/ML IJ SOLN
0.2500 mg | Freq: Once | INTRAMUSCULAR | Status: DC | PRN
  Filled 2015-07-26: qty 1

## 2015-07-26 MED ORDER — PHENYLEPHRINE 40 MCG/ML (10ML) SYRINGE FOR IV PUSH (FOR BLOOD PRESSURE SUPPORT)
80.0000 ug | PREFILLED_SYRINGE | INTRAVENOUS | Status: DC | PRN
  Filled 2015-07-26: qty 5

## 2015-07-26 MED ORDER — ACETAMINOPHEN 325 MG PO TABS
650.0000 mg | ORAL_TABLET | ORAL | Status: DC | PRN
Start: 2015-07-26 — End: 2015-07-28

## 2015-07-26 MED ORDER — OXYTOCIN BOLUS FROM INFUSION: 500.0000 mL | INTRAVENOUS | Status: DC

## 2015-07-26 MED ORDER — BENZOCAINE-MENTHOL 20-0.5 % EX AERO
1.0000 "application " | INHALATION_SPRAY | CUTANEOUS | Status: DC | PRN
  Administered 2015-07-26: 1 via TOPICAL
  Filled 2015-07-26: qty 56

## 2015-07-26 MED ORDER — DEXTROSE 5 % IV SOLN
2.5000 10*6.[IU] | INTRAVENOUS | Status: DC
  Filled 2015-07-26 (×13): qty 2.5

## 2015-07-26 MED ORDER — PRENATAL MULTIVITAMIN CH
1.0000 | ORAL_TABLET | Freq: Every day | ORAL | Status: DC
  Administered 2015-07-27: 1 via ORAL
  Filled 2015-07-26: qty 1

## 2015-07-26 MED ORDER — TETANUS-DIPHTH-ACELL PERTUSSIS 5-2.5-18.5 LF-MCG/0.5 IM SUSP: 0.5000 mL | Freq: Once | INTRAMUSCULAR | Status: DC

## 2015-07-26 MED ORDER — COCONUT OIL OIL
1.0000 "application " | TOPICAL_OIL | Status: DC | PRN
  Administered 2015-07-26: 1 via TOPICAL
  Filled 2015-07-26: qty 120

## 2015-07-26 MED ORDER — FLEET ENEMA 7-19 GM/118ML RE ENEM: 1.0000 | ENEMA | RECTAL | Status: DC | PRN

## 2015-07-26 MED ORDER — ONDANSETRON HCL 4 MG/2ML IJ SOLN: 4.0000 mg | INTRAMUSCULAR | Status: DC | PRN

## 2015-07-26 MED ORDER — FENTANYL 2.5 MCG/ML BUPIVACAINE 1/10 % EPIDURAL INFUSION (WH - ANES)
14.0000 mL/h | INTRAMUSCULAR | Status: DC | PRN
  Administered 2015-07-26: 14 mL/h via EPIDURAL
  Filled 2015-07-26: qty 125

## 2015-07-26 MED ORDER — ONDANSETRON HCL 4 MG PO TABS: 4.0000 mg | ORAL_TABLET | ORAL | Status: DC | PRN

## 2015-07-26 MED ORDER — BISACODYL 10 MG RE SUPP: 10.0000 mg | Freq: Every day | RECTAL | Status: DC | PRN

## 2015-07-26 MED ORDER — PENICILLIN G POTASSIUM 5000000 UNITS IJ SOLR
5.0000 10*6.[IU] | Freq: Once | INTRAMUSCULAR | Status: AC
  Administered 2015-07-26: 5 10*6.[IU] via INTRAVENOUS
  Filled 2015-07-26: qty 5

## 2015-07-26 NOTE — Anesthesia Preprocedure Evaluation (Signed)
Anesthesia Evaluation  Patient identified by MRN, date of birth, ID band Patient awake    Reviewed: Allergy & Precautions, Patient's Chart, lab work & pertinent test results  Airway Mallampati: II       Dental   Pulmonary neg pulmonary ROS,    Pulmonary exam normal        Cardiovascular negative cardio ROS Normal cardiovascular exam     Neuro/Psych negative neurological ROS     GI/Hepatic negative GI ROS, Neg liver ROS,   Endo/Other  negative endocrine ROS  Renal/GU negative Renal ROS     Musculoskeletal   Abdominal   Peds  Hematology negative hematology ROS (+)   Anesthesia Other Findings   Reproductive/Obstetrics (+) Pregnancy                             Lab Results  Component Value Date   WBC 8.8 07/26/2015   HGB 12.5 07/26/2015   HCT 36.2 07/26/2015   MCV 89.6 07/26/2015   PLT 206 07/26/2015   No results found for: CREATININE, BUN, NA, K, CL, CO2  Anesthesia Physical Anesthesia Plan  ASA: II  Anesthesia Plan: Epidural   Post-op Pain Management:    Induction:   Airway Management Planned: Natural Airway  Additional Equipment:   Intra-op Plan:   Post-operative Plan:   Informed Consent: I have reviewed the patients History and Physical, chart, labs and discussed the procedure including the risks, benefits and alternatives for the proposed anesthesia with the patient or authorized representative who has indicated his/her understanding and acceptance.     Plan Discussed with:   Anesthesia Plan Comments:         Anesthesia Quick Evaluation

## 2015-07-26 NOTE — Anesthesia Pain Management Evaluation Note (Signed)
  [facility] Pain Management Visit Note  Patient: Taylor NorrisEmina Furches, 30 y.o., female  "Hello I am a [facility] anesthesia [facility]. We have an anesthesia team available at all times to provide care throughout the hospital, including epidural management and anesthesia for C-section. I don't know your plan for the delivery whether it a natural birth, water birth, IV sedation, nitrous supplementation, doula or epidural, but we want to meet your pain goals."   1.Was your pain managed to your expectations on prior hospitalizations?   Yes   2.What is your expectation for pain management during this hospitalization?     Epidural  3.How can we help you reach that goal? epidural  Record the patient's initial score and the patient's pain goal.   Pain: 0  Pain Goal: 4 [facility] wants you to be able to say your pain was always managed very well.  Rendi Mapel 07/26/2015

## 2015-07-26 NOTE — Progress Notes (Signed)
2/50/vtx, AROM>>clr, will start PCn

## 2015-07-26 NOTE — Anesthesia Procedure Notes (Signed)
Epidural Patient location during procedure: OB  Staffing Anesthesiologist: Marcene DuosFITZGERALD, Benard Minturn Performed by: anesthesiologist   Preanesthetic Checklist Completed: patient identified, site marked, surgical consent, pre-op evaluation, timeout performed, IV checked, risks and benefits discussed and monitors and equipment checked  Epidural Patient position: sitting Prep: site prepped and draped and DuraPrep Patient monitoring: continuous pulse ox and blood pressure Approach: midline Location: L4-L5 Injection technique: LOR saline  Needle:  Needle type: Tuohy  Needle gauge: 17 G Needle length: 9 cm and 9 Needle insertion depth: 5 cm cm Catheter type: closed end flexible Catheter size: 19 Gauge Catheter at skin depth: 10 cm Test dose: negative  Assessment Events: blood not aspirated, injection not painful, no injection resistance, negative IV test and no paresthesia

## 2015-07-27 LAB — CBC
HCT: 32.8 % — ABNORMAL LOW (ref 36.0–46.0)
HEMOGLOBIN: 11.3 g/dL — AB (ref 12.0–15.0)
MCH: 31 pg (ref 26.0–34.0)
MCHC: 34.5 g/dL (ref 30.0–36.0)
MCV: 89.9 fL (ref 78.0–100.0)
PLATELETS: 180 10*3/uL (ref 150–400)
RBC: 3.65 MIL/uL — ABNORMAL LOW (ref 3.87–5.11)
RDW: 13.7 % (ref 11.5–15.5)
WBC: 11.9 10*3/uL — ABNORMAL HIGH (ref 4.0–10.5)

## 2015-07-27 NOTE — Lactation Note (Signed)
This note was copied from a baby's chart. Lactation Consultation Note; Mom called for assist with latch. Baby latched with NS when I went into room. I adjusted his head slightly and mom reports pain level of 4. Small amount of Colostrum noted in NS when he came off the breast. Baby fussy and still rooting. Would not latch to other breast in football hold. Mom hand expressed a few drops and baby licked it off her finger. Attempted in cross cradle hold baby latched but mom reports pain level of 6-7. Did ease off slightly. Still nursing as I left room. Mom has not pumped yet. Discussed cluster feeding the second night. No further questions at present. To call for assist prn  Patient Name: Taylor Miles XBJYN'WToday's Date: 07/27/2015 Reason for consult: Follow-up assessment   Maternal Data Formula Feeding for Exclusion: No Has patient been taught Hand Expression?: Yes Does the patient have breastfeeding experience prior to this delivery?: Yes  Feeding Feeding Type: Breast Fed  LATCH Score/Interventions Latch: Grasps breast easily, tongue down, lips flanged, rhythmical sucking. Intervention(s): Adjust position;Assist with latch;Breast massage;Breast compression  Audible Swallowing: A few with stimulation Intervention(s): Skin to skin;Hand expression  Type of Nipple: Everted at rest and after stimulation  Comfort (Breast/Nipple): Engorged, cracked, bleeding, large blisters, severe discomfort  Problem noted: Mild/Moderate discomfort Interventions  (Cracked/bleeding/bruising/blister): Expressed breast milk to nipple Interventions (Mild/moderate discomfort): Comfort gels;Hand expression Interventions (Severe discomfort): Double electric pum  Hold (Positioning): Assistance needed to correctly position infant at breast and maintain latch.  LATCH Score: 3  Lactation Tools Discussed/Used Tools: Nipple Shields Nipple shield size: 24 WIC Program: No Pump Review: Setup, frequency, and  cleaning Initiated by:: Taylor Miles Date initiated:: 07/27/15   Consult Status Consult Status: Follow-up Date: 07/28/15 Follow-up type: In-patient    Pamelia HoitWeeks, Pat Elicker D 07/27/2015, 2:23 PM

## 2015-07-27 NOTE — Anesthesia Postprocedure Evaluation (Signed)
Anesthesia Post Note  Patient: Meranda Halberstam  Procedure(s) Performed: * No procedures listed *  Patient location during evaluation: Mother Baby Anesthesia Type: Epidural Level of consciousness: awake and alert Pain management: satisfactory to patient Vital Signs Assessment: post-procedure vital signs reviewed and stable Respiratory status: respiratory function stable Cardiovascular status: stable Postop Assessment: no headache, no backache, epidural receding, patient able to bend at knees, no signs of nausea or vomiting and adequate PO intake Anesthetic complications: no Comments: Comfort level was assessed by AnesthesiaTeam and the patient was pleased with the care, interventions, and services provided by the Department of Anesthesia.     Last Vitals:  Filed Vitals:   07/26/15 2215 07/27/15 0602  BP: 103/61 95/62  Pulse: 62 79  Temp: 36.7 C   Resp: 18 18    Last Pain:  Filed Vitals:   07/27/15 0602  PainSc: 3    Pain Goal: Patients Stated Pain Goal: 3 (07/26/15 1930)               Karleen DolphinFUSSELL,Yasmene Salomone

## 2015-07-27 NOTE — Progress Notes (Signed)
Post Partum Day 1 Subjective: no complaints  Objective: Blood pressure 95/62, pulse 79, temperature 98 F (36.7 C), temperature source Oral, resp. rate 18, height 5\' 8"  (1.727 m), weight 156 lb (70.761 kg), last menstrual period 10/24/2014, SpO2 100 %, unknown if currently breastfeeding.  Physical Exam:  General: alert Lochia: appropriate Uterine Fundus: firm Incision: healing well DVT Evaluation: No evidence of DVT seen on physical exam.   Recent Labs  07/26/15 0745 07/27/15 0613  HGB 12.5 11.3*  HCT 36.2 32.8*    Assessment/Plan: Plan for discharge tomorrow   LOS: 1 day   Lasandra Batley M 07/27/2015, 9:08 AM

## 2015-07-27 NOTE — Lactation Note (Signed)
This note was copied from a baby's chart. Lactation Consultation Note; Initial visit with mom. She complains of severe pain with latch. Both nipples with cracking noted. Using #24 NS, reports that feels some better but still painful,. Tried #20 but states #24 felt better. Baby had circ this morning and is sleepy now. Attempted to latch without NS. Took a few sucks and mom reports much pain. Used #24 NS and mom still complains of pain. Took a few sucks then off to sleep. Mom reports feels like baby is biting at the breast. When sucking on my gloved finger baby took a while to get in a good sucking pattern and seems to have limited tongue mobility. Encouraged mom to talk with Ped about this at next visit. Offered pumping to mom to promote milk supply and mom agreeable. DEBP setup for mom- reviewed use and cleaning of pump pieces. Has Medela pump at home. Encouraged to page for assist when baby wakes for feeding. BF brochure given with resources for support after DC. No questions at present. Reports she had much pain with first baby, had a low milk supply and gave up after a few Jowanna Loeffler. Mom states she has mass in right breast. Has ultrasound on it - no changes in 3 months.   Patient Name: Boy Ashok Norrismina Bissonnette AVWUJ'WToday's Date: 07/27/2015 Reason for consult: Initial assessment   Maternal Data Formula Feeding for Exclusion: No Has patient been taught Hand Expression?: Yes Does the patient have breastfeeding experience prior to this delivery?: Yes  Feeding Feeding Type: Breast Fed  LATCH Score/Interventions Latch: Too sleepy or reluctant, no latch achieved, no sucking elicited. (few painful sucks) Intervention(s): Adjust position;Assist with latch;Breast massage;Breast compression  Audible Swallowing: None Intervention(s): Skin to skin;Hand expression  Type of Nipple: Everted at rest and after stimulation  Comfort (Breast/Nipple): Engorged, cracked, bleeding, large blisters, severe discomfort  Problem  noted: Severe discomfort Interventions  (Cracked/bleeding/bruising/blister): Expressed breast milk to nipple Interventions (Mild/moderate discomfort): Comfort gels Interventions (Severe discomfort): Double electric pum  Hold (Positioning): Assistance needed to correctly position infant at breast and maintain latch.  LATCH Score: 3  Lactation Tools Discussed/Used Tools: Nipple Shields Nipple shield size: 24 WIC Program: No Pump Review: Setup, frequency, and cleaning Initiated by:: DW Date initiated:: 07/27/15   Consult Status Consult Status: Follow-up Date: 07/27/15 Follow-up type: In-patient    Pamelia HoitWeeks, Lashauna Arpin D 07/27/2015, 12:48 PM

## 2015-07-28 ENCOUNTER — Inpatient Hospital Stay (HOSPITAL_COMMUNITY): Admission: AD | Admit: 2015-07-28 | Payer: 59 | Source: Ambulatory Visit | Admitting: Obstetrics and Gynecology

## 2015-07-28 NOTE — Progress Notes (Signed)
Reviewed Discharge instructions for mom and baby in room. Patient had no questions. Discharge AVS signed. Mom instructed to call out to nurses station when they are ready to leave and baby is in car seat. Hugs tag will be removed directly before leaving hospital.  

## 2015-07-28 NOTE — Discharge Summary (Signed)
Obstetric Discharge Summary Reason for Admission: induction of labor Prenatal Procedures: none Intrapartum Procedures: spontaneous vaginal delivery Postpartum Procedures: none Complications-Operative and Postpartum: none HEMOGLOBIN  Date Value Ref Range Status  07/27/2015 11.3* 12.0 - 15.0 g/dL Final   HCT  Date Value Ref Range Status  07/27/2015 32.8* 36.0 - 46.0 % Final    Physical Exam:  General: alert Lochia: appropriate Uterine Fundus: firm Incision: healing well DVT Evaluation: No evidence of DVT seen on physical exam.  Discharge Diagnoses: Term Pregnancy-delivered  Discharge Information: Date: 07/28/2015 Activity: pelvic rest Diet: routine Medications: PNV and Ibuprofen Condition: stable Instructions: refer to practice specific booklet Discharge to: home Follow-up Information    Schedule an appointment as soon as possible for a visit in 6 weeks to follow up.      Newborn Data: Live born female  Birth Weight: 7 lb 4.6 oz (3305 g) APGAR: 8, 9  Home with mother.  Taylor Miles M 07/28/2015, 8:42 AM

## 2015-07-31 NOTE — H&P (Signed)
Taylor Miles, Taylor Miles               ACCOUNT NO.:  [id number]  MEDICAL RECORD NO.:  [id number]  LOCATION:                                FACILITY:  [facility]  PHYSICIAN:  Duke Salviaichard M. Marcelle OverlieHolland, M.D.DATE OF BIRTH:  June 29, 1985  DATE OF ADMISSION:  07/26/2015 DATE OF DISCHARGE:                             HISTORY & PHYSICAL   CHIEF COMPLAINT:  For labor induction at term, favorable cervix.  HISTORY OF PRESENT ILLNESS:  A 30 year old, 663, P1-0-1-1, EDD is July 5, presents for induction at term with a favorable cervix.  She has a positive GBS screen.  Her 1 hour GTT was normal at 132 blood type is O positive.  First trimester screen returned normal.  PAST MEDICAL HISTORY:  Please see the [facility] form for details.  PHYSICAL EXAMINATION:  VITAL SIGNS:  Temp 98.2, blood pressure 108/68. HEENT:  Unremarkable. NECK:  Supple without masses. LUNGS:  Clear. CARDIOVASCULAR:  Regular rate and rhythm without murmurs, rubs, gallops noted. BREASTS:  Without masses. GU:  Term fundal height.  Fetal heart rate 140, cervix was 2, 50% vertex -2. EXTREMITIES:  Unremarkable. NEUROLOGIC:  Unremarkable.  IMPRESSION:  Term pregnancy, favorable cervix for AROM induction, IV penicillin for positive GBS.     Billye Pickerel M. Marcelle OverlieHolland, M.D.     [facility]MED[facility]  D:  07/25/2015  T:  07/25/2015  Job:  [id number]

## 2017-04-12 IMAGING — US US RT BREAST BX
1 series · 13 of 14 positions shown · non-contrast
Comparison: Previous exam(s).

ADDENDUM:
Pathology revealed BENIGN FIBROGLANDULAR BREAST TISSUE WITH
GESTATIONAL CHANGES of the Right axillary tail. This was found to be
concordant by Dr. Bobi Hoff. Pathology results were discussed
with the patient by telephone. The patient reported doing well after
the biopsy with tenderness and bruising at the site. Post biopsy
instructions and care were reviewed and questions were answered. The
patient was encouraged to call [REDACTED] for any additional concerns. The patient was asked to return
for a right breast\axillary tail ultrasound in 6 months and
informed a reminder notice would be sent regarding this appointment.

Pathology results reported by Ancsii Musitz, RN on 06/11/2015.
CLINICAL DATA: Patient presents for ultrasound-guided core needle
biopsy of a heterogeneous mass in the axillary tail region of the
right breast.
EXAM:
ULTRASOUND GUIDED RIGHT BREAST CORE NEEDLE BIOPSY

[Series 1: advbreast · 13 of 14 slices shown]
[im 1/14]
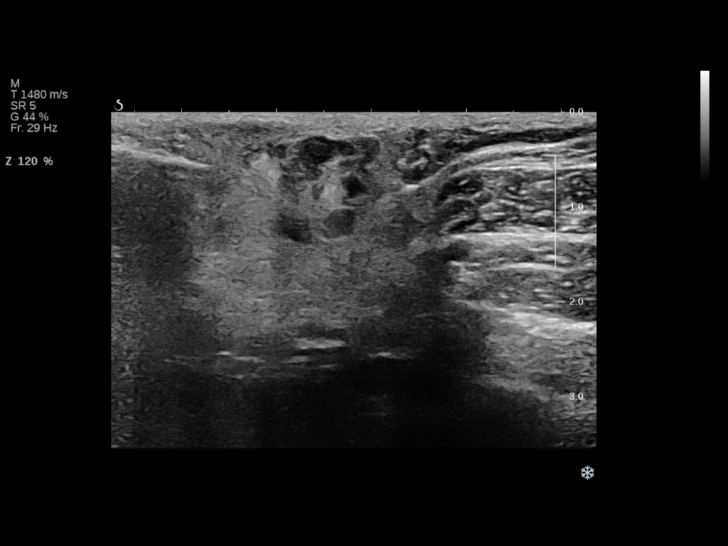
[im 2/14]
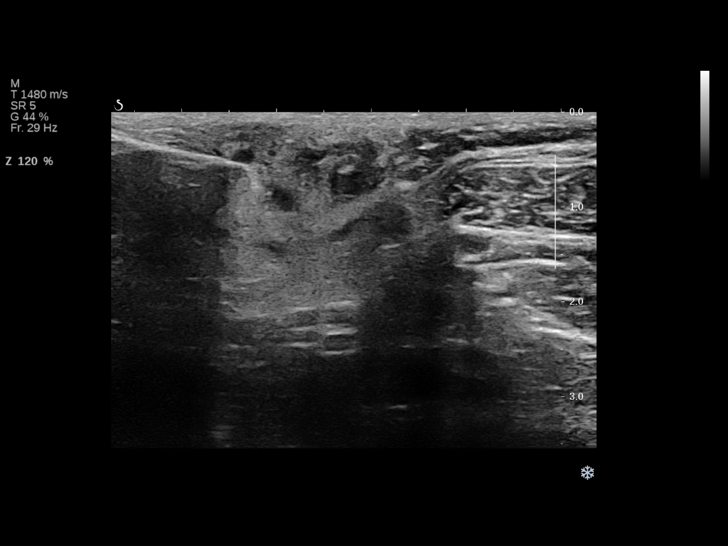
[im 3/14]
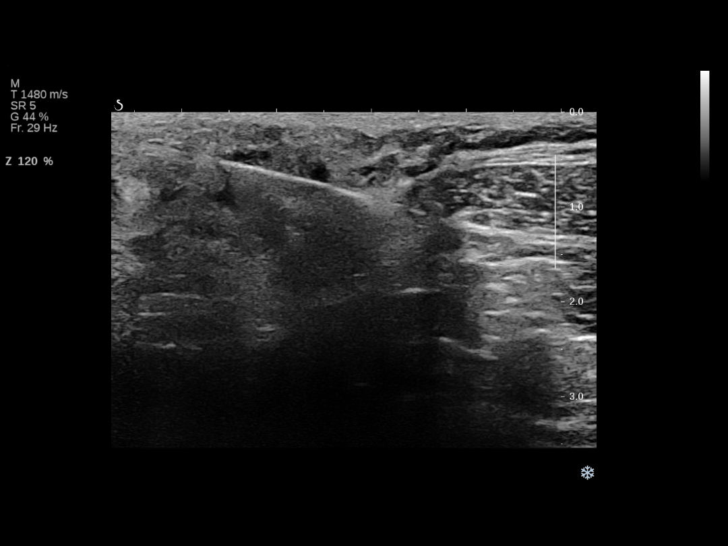
[im 4/14]
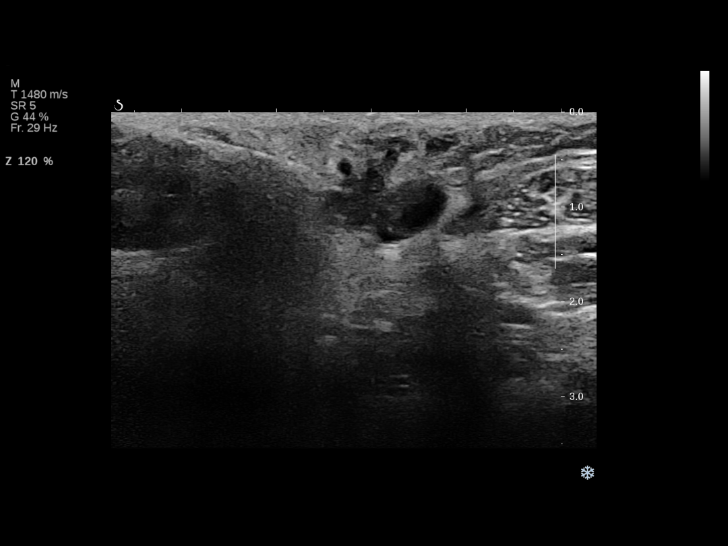
[im 5/14]
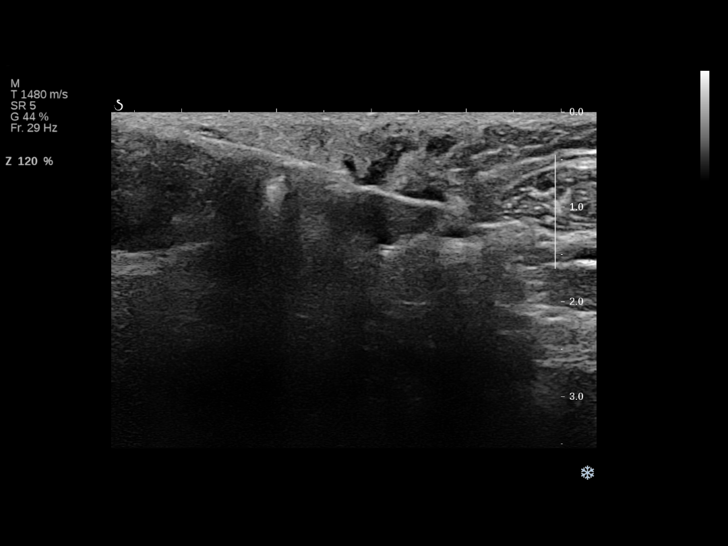
[im 6/14]
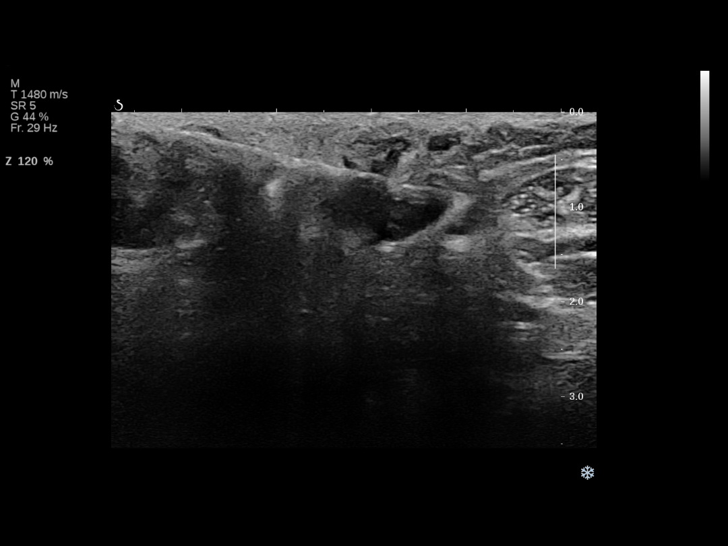
[im 8/14]
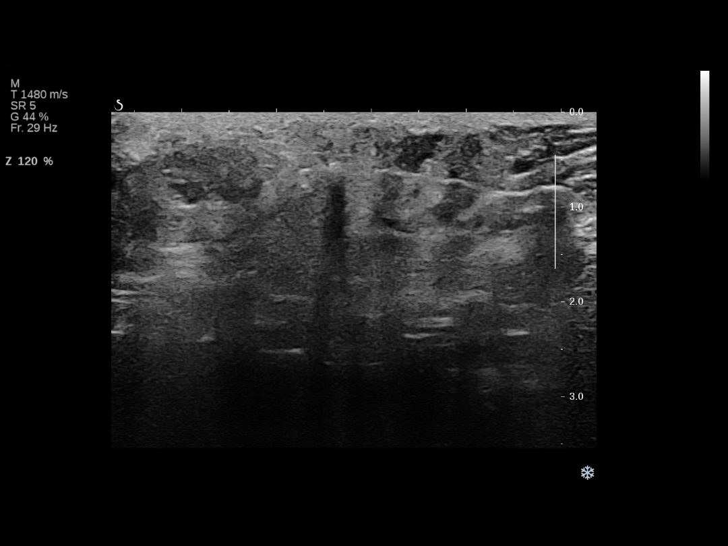
[im 9/14]
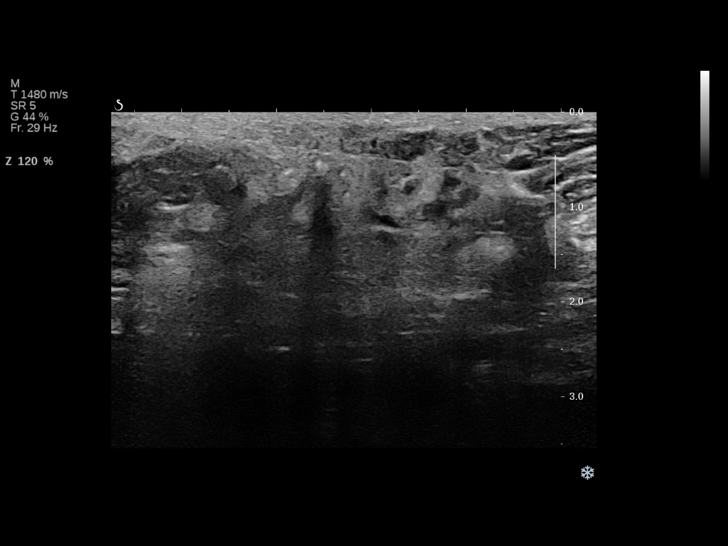
[im 10/14]
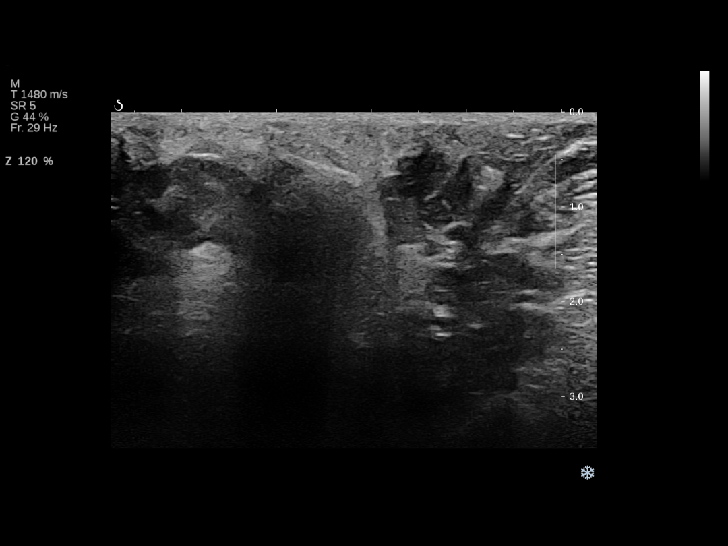
[im 11/14]
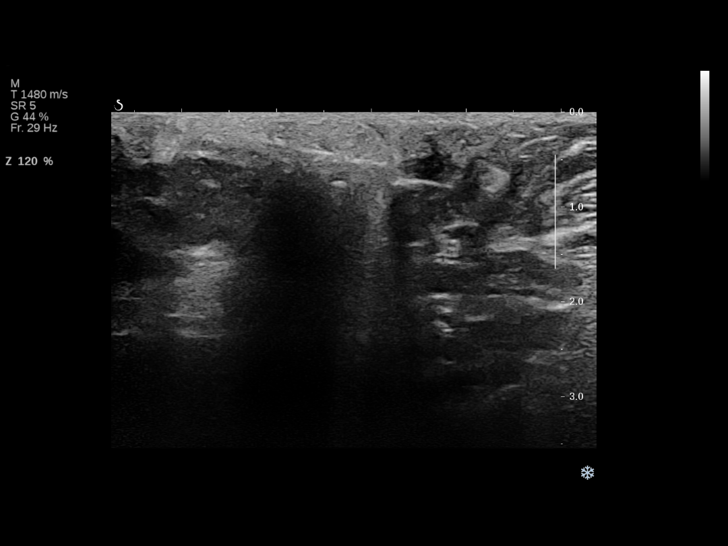
[im 12/14]
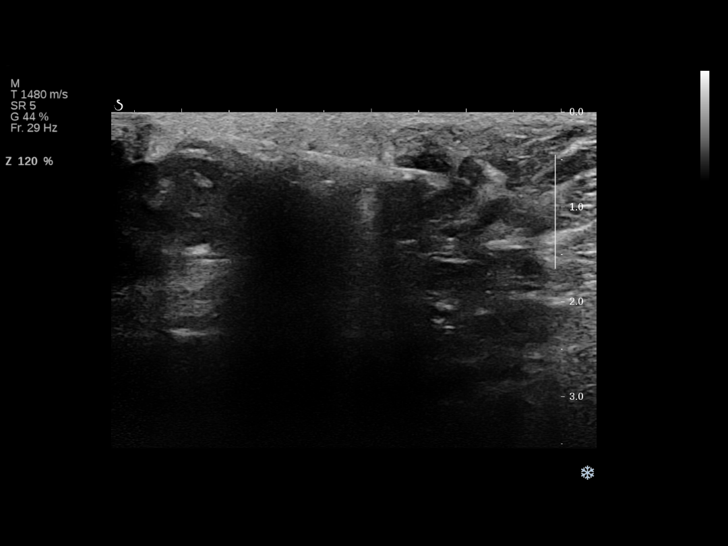
[im 13/14]
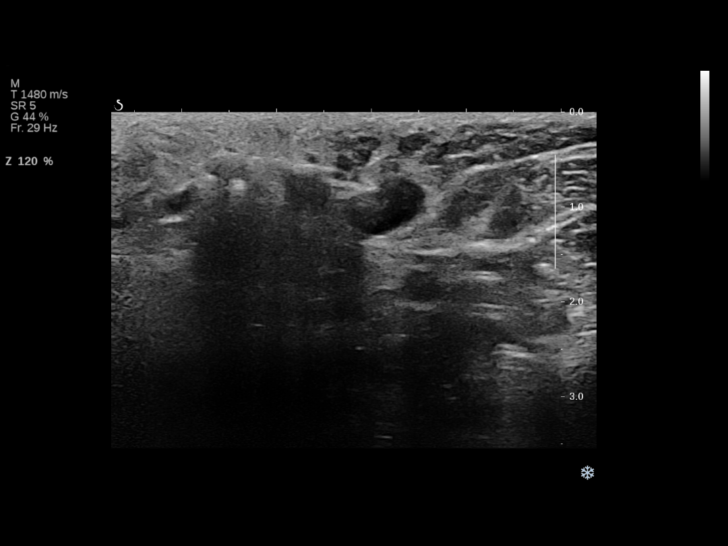
[im 14/14]
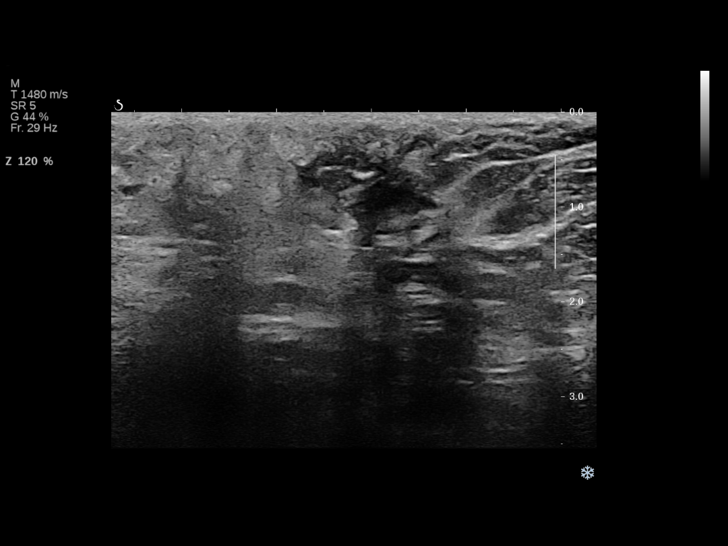

[13 of 14 positions shown; findings below may reference images not displayed]



Using sterile technique and 1% Lidocaine as local anesthetic, under
direct ultrasound visualization, a 14 gauge Kulimbulula device was
used to perform biopsy of the heterogeneous 1.7 cm axillary tail
region right breast mass using a lateral approach. At the conclusion
of the procedure a ribbon shaped tissue marker clip was deployed
into the biopsy cavity.
IMPRESSION: Ultrasound guided biopsy of a right axillary tail region mass. No
apparent complications.
